# Patient Record
Sex: Male | Born: 1980 | Race: Black or African American | Hispanic: No | Marital: Single | Smoking: Former smoker
Health system: Southern US, Community
[De-identification: ages and names within clinical notes are randomized; demographics above are authoritative.]

## PROBLEM LIST (undated history)

## (undated) HISTORY — PX: ORTHOPEDIC SURGERY: SHX850

---

## 2001-06-30 ENCOUNTER — Encounter: Payer: Self-pay | Admitting: Internal Medicine

## 2001-06-30 ENCOUNTER — Emergency Department (HOSPITAL_COMMUNITY): Admission: EM | Admit: 2001-06-30 | Discharge: 2001-06-30 | Payer: Self-pay | Admitting: Emergency Medicine

## 2003-08-05 ENCOUNTER — Emergency Department (HOSPITAL_COMMUNITY): Admission: EM | Admit: 2003-08-05 | Discharge: 2003-08-05 | Payer: Self-pay | Admitting: Emergency Medicine

## 2005-02-19 ENCOUNTER — Emergency Department (HOSPITAL_COMMUNITY): Admission: EM | Admit: 2005-02-19 | Discharge: 2005-02-19 | Payer: Self-pay | Admitting: Emergency Medicine

## 2009-01-03 ENCOUNTER — Emergency Department (HOSPITAL_COMMUNITY): Admission: EM | Admit: 2009-01-03 | Discharge: 2009-01-03 | Payer: Self-pay | Admitting: Emergency Medicine

## 2009-03-01 ENCOUNTER — Emergency Department (HOSPITAL_COMMUNITY): Admission: EM | Admit: 2009-03-01 | Discharge: 2009-03-01 | Payer: Self-pay | Admitting: Emergency Medicine

## 2010-02-18 ENCOUNTER — Emergency Department (HOSPITAL_COMMUNITY): Admission: EM | Admit: 2010-02-18 | Discharge: 2010-02-18 | Payer: Self-pay | Admitting: Emergency Medicine

## 2010-02-21 ENCOUNTER — Emergency Department (HOSPITAL_COMMUNITY): Admission: EM | Admit: 2010-02-21 | Discharge: 2010-02-21 | Payer: Self-pay | Admitting: Emergency Medicine

## 2010-07-21 ENCOUNTER — Emergency Department (HOSPITAL_COMMUNITY): Payer: Self-pay

## 2010-07-21 ENCOUNTER — Emergency Department (HOSPITAL_COMMUNITY)
Admission: EM | Admit: 2010-07-21 | Discharge: 2010-07-22 | Disposition: A | Discharge status: Home / Self Care | Payer: Self-pay | Attending: Emergency Medicine | Admitting: Emergency Medicine

## 2010-07-21 DIAGNOSIS — M25579 Pain in unspecified ankle and joints of unspecified foot: Secondary | ICD-10-CM | POA: Insufficient documentation

## 2010-07-22 ENCOUNTER — Emergency Department (HOSPITAL_COMMUNITY): Payer: Self-pay

## 2010-10-12 ENCOUNTER — Emergency Department (HOSPITAL_COMMUNITY)
Admission: EM | Admit: 2010-10-12 | Discharge: 2010-10-13 | Disposition: A | Discharge status: Home / Self Care | Payer: Self-pay | Attending: Emergency Medicine | Admitting: Emergency Medicine

## 2010-10-12 DIAGNOSIS — K5289 Other specified noninfective gastroenteritis and colitis: Secondary | ICD-10-CM | POA: Insufficient documentation

## 2010-10-12 DIAGNOSIS — G40802 Other epilepsy, not intractable, without status epilepticus: Secondary | ICD-10-CM | POA: Insufficient documentation

## 2010-10-12 DIAGNOSIS — Z79899 Other long term (current) drug therapy: Secondary | ICD-10-CM | POA: Insufficient documentation

## 2010-10-12 LAB — BASIC METABOLIC PANEL
GFR calc non Af Amer: >60 mL/min (ref >60)
Potassium: 3.7 mEq/L (ref 3.5–5.1)
Sodium: 141 mEq/L (ref 135–145)

## 2010-10-12 LAB — URINALYSIS, ROUTINE W REFLEX MICROSCOPIC
Glucose, UA: NEGATIVE mg/dL
Hgb urine dipstick: NEGATIVE
Specific Gravity, Urine: 1.02 (ref 1.005–1.030)
Urobilinogen, UA: 1 mg/dL (ref 0.0–1.0)
pH: 7 (ref 5.0–8.0)

## 2010-10-12 LAB — URINE MICROSCOPIC-ADD ON

## 2010-10-12 LAB — DIFFERENTIAL
Basophils Absolute: 0 10*3/uL (ref 0.0–0.1)
Eosinophils Absolute: 0 10*3/uL (ref 0.0–0.7)
Eosinophils Relative: 0 % (ref 0–5)
Lymphocytes Relative: 12 % (ref 12–46)
Neutrophils Relative %: 79 % — ABNORMAL HIGH (ref 43–77)

## 2010-10-12 LAB — CBC
Platelets: 255 10*3/uL (ref 150–400)
RBC: 5.67 MIL/uL (ref 4.22–5.81)
RDW: 13.1 % (ref 11.5–15.5)
WBC: 14.6 10*3/uL — ABNORMAL HIGH (ref 4.0–10.5)

## 2010-10-12 LAB — HEPATIC FUNCTION PANEL
ALT: 16 U/L (ref 0–53)
AST: 14 U/L (ref 0–37)
Albumin: 4.5 g/dL (ref 3.5–5.2)
Indirect Bilirubin: 0.1 mg/dL — ABNORMAL LOW (ref 0.3–0.9)
Total Protein: 8.3 g/dL (ref 6.0–8.3)
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
## 2012-11-03 ENCOUNTER — Encounter (HOSPITAL_COMMUNITY): Payer: Self-pay | Admitting: *Deleted

## 2012-11-03 ENCOUNTER — Emergency Department (HOSPITAL_COMMUNITY)
Admission: EM | Admit: 2012-11-03 | Discharge: 2012-11-03 | Disposition: A | Discharge status: Home / Self Care | Payer: No Typology Code available for payment source | Attending: Emergency Medicine | Admitting: Emergency Medicine

## 2012-11-03 DIAGNOSIS — S4980XA Other specified injuries of shoulder and upper arm, unspecified arm, initial encounter: Secondary | ICD-10-CM | POA: Insufficient documentation

## 2012-11-03 DIAGNOSIS — S46909A Unspecified injury of unspecified muscle, fascia and tendon at shoulder and upper arm level, unspecified arm, initial encounter: Secondary | ICD-10-CM | POA: Insufficient documentation

## 2012-11-03 DIAGNOSIS — F17200 Nicotine dependence, unspecified, uncomplicated: Secondary | ICD-10-CM | POA: Insufficient documentation

## 2012-11-03 DIAGNOSIS — Y9389 Activity, other specified: Secondary | ICD-10-CM | POA: Insufficient documentation

## 2012-11-03 DIAGNOSIS — Y929 Unspecified place or not applicable: Secondary | ICD-10-CM | POA: Insufficient documentation

## 2012-11-03 DIAGNOSIS — M5380 Other specified dorsopathies, site unspecified: Secondary | ICD-10-CM | POA: Insufficient documentation

## 2012-11-03 DIAGNOSIS — M62830 Muscle spasm of back: Secondary | ICD-10-CM

## 2012-11-03 MED ORDER — CYCLOBENZAPRINE HCL 5 MG PO TABS: 5.0000 mg | ORAL_TABLET | Freq: Three times a day (TID) | ORAL | Status: DC | PRN

## 2012-11-03 MED ORDER — IBUPROFEN 600 MG PO TABS: 600.0000 mg | ORAL_TABLET | Freq: Four times a day (QID) | ORAL | Status: DC | PRN

## 2012-11-03 MED ORDER — IBUPROFEN 800 MG PO TABS
800.0000 mg | ORAL_TABLET | Freq: Once | ORAL | Status: AC
  Administered 2012-11-03: 800 mg via ORAL
  Filled 2012-11-03: qty 1
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
## 2012-11-07 NOTE — ED Provider Notes (Signed)
Medical screening examination/treatment/procedure(s) were performed by non-physician practitioner and as supervising physician I was immediately available for consultation/collaboration. Devoria Albe, MD, Armando Gang   Ward Givens, MD 11/07/12 1630

## 2013-03-02 ENCOUNTER — Emergency Department (HOSPITAL_COMMUNITY)
Admission: EM | Admit: 2013-03-02 | Discharge: 2013-03-02 | Disposition: A | Discharge status: Home / Self Care | Payer: Self-pay | Attending: Emergency Medicine | Admitting: Emergency Medicine

## 2013-03-02 ENCOUNTER — Encounter (HOSPITAL_COMMUNITY): Payer: Self-pay | Admitting: Emergency Medicine

## 2013-03-02 ENCOUNTER — Emergency Department (HOSPITAL_COMMUNITY): Payer: No Typology Code available for payment source

## 2013-03-02 DIAGNOSIS — R296 Repeated falls: Secondary | ICD-10-CM | POA: Insufficient documentation

## 2013-03-02 DIAGNOSIS — Y9389 Activity, other specified: Secondary | ICD-10-CM | POA: Insufficient documentation

## 2013-03-02 DIAGNOSIS — S62233A Other displaced fracture of base of first metacarpal bone, unspecified hand, initial encounter for closed fracture: Secondary | ICD-10-CM | POA: Insufficient documentation

## 2013-03-02 DIAGNOSIS — S62329A Displaced fracture of shaft of unspecified metacarpal bone, initial encounter for closed fracture: Secondary | ICD-10-CM

## 2013-03-02 DIAGNOSIS — Y929 Unspecified place or not applicable: Secondary | ICD-10-CM | POA: Insufficient documentation

## 2013-03-02 DIAGNOSIS — F17200 Nicotine dependence, unspecified, uncomplicated: Secondary | ICD-10-CM | POA: Insufficient documentation

## 2013-03-02 NOTE — ED Notes (Signed)
Patient states he fell backwards and caught himself with his right hand.  Patient c/o right hand pain.

## 2013-03-02 NOTE — ED Provider Notes (Signed)
CSN: [id number]     Arrival date & time 03/14/13  0113 History   First MD Initiated Contact with Patient 2013-03-14 0230     Chief Complaint  Patient presents with  . Hand Injury   (Consider location/radiation/quality/duration/timing/severity/associated sxs/prior Treatment) HPI Is a 32 year old male who fell backwards onto his right outstretched hand. This occurred several hours ago. He is now having pain and swelling over his right first metacarpal. There is moderate pain, worse with movement or palpation. Range of motion of his hand is limited due to pain and swelling. He denies numbness. He denies other injury.  History reviewed. No pertinent past medical history. Past Surgical History  Procedure Laterality Date  . Orthopedic surgery     No family history on file. History  Substance Use Topics  . Smoking status: Current Every Day Smoker  . Smokeless tobacco: Not on file  . Alcohol Use: No    Review of Systems  All other systems reviewed and are negative.    Allergies  Aspirin  Home Medications   Current Outpatient Rx  Name  Route  Sig  Dispense  Refill  . cyclobenzaprine (FLEXERIL) 5 MG tablet   Oral   Take 1 tablet (5 mg total) by mouth 3 (three) times daily as needed for muscle spasms.   15 tablet   0   . ibuprofen (ADVIL,MOTRIN) 600 MG tablet   Oral   Take 1 tablet (600 mg total) by mouth every 6 (six) hours as needed for pain.   24 tablet   0    BP 117/91  Pulse 90  Temp(Src) 98 F (36.7 C)  Resp 20  Ht 5\' 6"  (1.676 m)  Wt 170 lb (77.111 kg)  BMI 27.45 kg/m2  SpO2 100%  Physical Exam General: Well-developed, well-nourished male in no acute distress; appearance consistent with age of record HENT: normocephalic; atraumatic Eyes: Normal appearance Neck: supple; nontender Heart: regular rate and rhythm Lungs: Normal respiratory effort and excursion Abdomen: soft; nondistended Extremities: Swelling and tenderness over the right first metacarpal  without deformity; decreased range of motion of right hand due to pain; sensation intact with distal capillary refill brisk; no snuffbox tenderness Neurologic: Awake, alert and oriented; motor function intact in all extremities and symmetric; no facial droop Skin: Warm and dry Psychiatric: Normal mood and affect    ED Course  Procedures (including critical care time)  MDM  Nursing notes and vitals signs, including pulse oximetry, reviewed.  Summary of this visit's results, reviewed by myself:  Imaging Studies: Dg Hand Complete Right  03/14/2013   CLINICAL DATA:  Right hand injury, fall.  EXAM: RIGHT HAND - COMPLETE 3+ VIEW  COMPARISON:  None.  FINDINGS: A small bone fragment noted along the distal aspect of the right 1st metacarpal. There is mild overlying soft tissue swelling. Cannot exclude a small avulsed fragment. This may also be related to old injury. No additional acute bony abnormality.  IMPRESSION: Question small avulsed fragment off the distal aspect of the right 1st metacarpal.   Electronically Signed   By: Charlett Nose M.D.   On: 03/14/13 02:34        Hanley Seamen, MD 2013/03/14 [phone]

## 2014-03-30 ENCOUNTER — Emergency Department (HOSPITAL_COMMUNITY): Payer: Self-pay

## 2014-03-30 ENCOUNTER — Encounter (HOSPITAL_COMMUNITY): Payer: Self-pay | Admitting: Emergency Medicine

## 2014-03-30 ENCOUNTER — Emergency Department (HOSPITAL_COMMUNITY)
Admission: EM | Admit: 2014-03-30 | Discharge: 2014-03-30 | Disposition: A | Discharge status: Home / Self Care | Payer: Self-pay | Attending: Emergency Medicine | Admitting: Emergency Medicine

## 2014-03-30 DIAGNOSIS — S20219A Contusion of unspecified front wall of thorax, initial encounter: Secondary | ICD-10-CM | POA: Insufficient documentation

## 2014-03-30 DIAGNOSIS — Z79899 Other long term (current) drug therapy: Secondary | ICD-10-CM | POA: Insufficient documentation

## 2014-03-30 DIAGNOSIS — W01198A Fall on same level from slipping, tripping and stumbling with subsequent striking against other object, initial encounter: Secondary | ICD-10-CM | POA: Insufficient documentation

## 2014-03-30 DIAGNOSIS — R52 Pain, unspecified: Secondary | ICD-10-CM

## 2014-03-30 DIAGNOSIS — Z720 Tobacco use: Secondary | ICD-10-CM | POA: Insufficient documentation

## 2014-03-30 DIAGNOSIS — Z791 Long term (current) use of non-steroidal anti-inflammatories (NSAID): Secondary | ICD-10-CM | POA: Insufficient documentation

## 2014-03-30 DIAGNOSIS — Y9389 Activity, other specified: Secondary | ICD-10-CM | POA: Insufficient documentation

## 2014-03-30 DIAGNOSIS — L089 Local infection of the skin and subcutaneous tissue, unspecified: Secondary | ICD-10-CM | POA: Insufficient documentation

## 2014-03-30 DIAGNOSIS — S20211A Contusion of right front wall of thorax, initial encounter: Secondary | ICD-10-CM

## 2014-03-30 DIAGNOSIS — Y9289 Other specified places as the place of occurrence of the external cause: Secondary | ICD-10-CM | POA: Insufficient documentation

## 2014-03-30 DIAGNOSIS — S60922A Unspecified superficial injury of left hand, initial encounter: Secondary | ICD-10-CM | POA: Insufficient documentation

## 2014-03-30 DIAGNOSIS — R0781 Pleurodynia: Secondary | ICD-10-CM

## 2014-03-30 MED ORDER — SULFAMETHOXAZOLE-TRIMETHOPRIM 800-160 MG PO TABS: 1.0000 | ORAL_TABLET | Freq: Two times a day (BID) | ORAL | Status: AC

## 2014-03-30 MED ORDER — TETANUS-DIPHTH-ACELL PERTUSSIS 5-2.5-18.5 LF-MCG/0.5 IM SUSP: 0.5000 mL | Freq: Once | INTRAMUSCULAR | Status: DC
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
## 2014-03-30 NOTE — ED Notes (Signed)
Pt soaking his hand in saline and betadine soak per orders.

## 2014-03-30 NOTE — Discharge Instructions (Signed)
Follow up with your doctor in 3 days or return here for recheck. Return sooner for worsening symptoms.
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
## 2014-03-30 NOTE — ED Provider Notes (Signed)
Medical screening examination/treatment/procedure(s) were conducted as a shared visit with non-physician practitioner(s) and myself.  I personally evaluated the patient during the encounter.  Suspect local wound infection doubt flexor tenosynovitis or septic joint; patient's thumb with good flexion against resistance with full active and passive extension without pain; volar aspect of thumb over IP joint as well as the thenar eminence otherwise nontender.    Hurman HornJohn M Jimmie Rueter, MD 03/31/14 [phone]

## 2019-10-03 ENCOUNTER — Ambulatory Visit (INDEPENDENT_AMBULATORY_CARE_PROVIDER_SITE_OTHER): Payer: Self-pay | Admitting: Internal Medicine

## 2021-12-26 ENCOUNTER — Emergency Department (HOSPITAL_COMMUNITY)
Admission: EM | Admit: 2021-12-26 | Discharge: 2021-12-26 | Disposition: A | Discharge status: Home / Self Care | Payer: Medicaid Other | Attending: Emergency Medicine | Admitting: Emergency Medicine

## 2021-12-26 ENCOUNTER — Encounter (HOSPITAL_COMMUNITY): Payer: Self-pay

## 2021-12-26 ENCOUNTER — Other Ambulatory Visit: Payer: Self-pay

## 2021-12-26 DIAGNOSIS — R21 Rash and other nonspecific skin eruption: Secondary | ICD-10-CM

## 2021-12-26 MED ORDER — TRIAMCINOLONE ACETONIDE 0.1 % EX CREA: 1.0000 | TOPICAL_CREAM | Freq: Two times a day (BID) | CUTANEOUS | 0 refills | Status: DC

## 2021-12-26 NOTE — ED Triage Notes (Signed)
Rash on left arm and neck that started a few days again. Pt started a new supplement a few days ago. Pt reports that it itches sometimes.

## 2021-12-26 NOTE — Discharge Instructions (Addendum)
I sent you in a topical steroid that you can apply to your rash twice daily.  Please do not take this longer than 1 week as it can cause thinning of the skin and bleaching.  If you are still having symptoms of the rash in 1 week, please follow-up with your PCP for additional evaluation.  You can also take over-the-counter Benadryl which may help with your symptoms.

## 2021-12-27 NOTE — ED Provider Notes (Signed)
[facility] EMERGENCY DEPARTMENT Provider Note   CSN: [id number] Arrival date & time: 12/26/21  1503     History  Chief Complaint  Patient presents with   Rash    Andrew Robinson is a 41 y.o. male who presents to the ED for a rash on his left antecubital fossa and the back of his neck that started a few days ago.  Rash is nonpainful though it is occasionally pruritic.  He states that he started new supplement a few days ago which he thinks may have triggered his symptoms.  He denies new soaps, detergents, lotions.  He denies chest pain, shortness of breath, facial swelling, tongue swelling or hives.   Rash Associated symptoms: no fever, no nausea, no shortness of breath and not vomiting        Home Medications Prior to Admission medications   Medication Sig Start Date End Date Taking? Authorizing Provider  triamcinolone cream (KENALOG) 0.1 % Apply 1 Application topically 2 (two) times daily. 12/26/21  Yes Raynald Blend R, PA-C  cyclobenzaprine (FLEXERIL) 5 MG tablet Take 1 tablet (5 mg total) by mouth 3 (three) times daily as needed for muscle spasms. 11/03/12   Burgess Amor, PA-C  ibuprofen (ADVIL,MOTRIN) 600 MG tablet Take 1 tablet (600 mg total) by mouth every 6 (six) hours as needed for pain. 11/03/12   Burgess Amor, PA-C      Allergies    Aspirin    Review of Systems   Review of Systems  Constitutional:  Negative for fever.  HENT:  Negative for facial swelling.   Respiratory:  Negative for shortness of breath.   Cardiovascular:  Negative for chest pain.  Gastrointestinal:  Negative for nausea and vomiting.  Skin:  Positive for rash.    Physical Exam Updated Vital Signs BP 121/85 (BP Location: Right Arm)   Pulse 91   Temp 97.9 F (36.6 C) (Oral)   Resp 18   Ht 5\' 5"  (1.651 m)   Wt 90.7 kg   SpO2 98%   BMI 33.28 kg/m  Physical Exam Vitals and nursing note reviewed.  Constitutional:      General: He is not in acute distress.    Appearance: He is not  ill-appearing.  HENT:     Head: Atraumatic.  Eyes:     Conjunctiva/sclera: Conjunctivae normal.  Cardiovascular:     Rate and Rhythm: Normal rate and regular rhythm.     Pulses: Normal pulses.     Heart sounds: No murmur heard. Pulmonary:     Effort: Pulmonary effort is normal. No respiratory distress.     Breath sounds: Normal breath sounds.  Abdominal:     General: Abdomen is flat. There is no distension.     Palpations: Abdomen is soft.     Tenderness: There is no abdominal tenderness.  Musculoskeletal:        General: Normal range of motion.     Cervical back: Normal range of motion.  Skin:    General: Skin is warm and dry.     Capillary Refill: Capillary refill takes less than 2 seconds.     Comments: Small erythematous rash on the left antecubital fossa and on the back of his neck.  No urticaria, excoriations, induration or papules.  Neurological:     General: No focal deficit present.     Mental Status: He is alert.  Psychiatric:        Mood and Affect: Mood normal.     ED Results /  Procedures / Treatments   Labs (all labs ordered are listed, but only abnormal results are displayed) Labs Reviewed - No data to display  EKG None  Radiology No results found.  Procedures Procedures    Medications Ordered in ED Medications - No data to display  ED Course/ Medical Decision Making/ A&P                           Medical Decision Making Risk Prescription drug management.   41 year old male presents to the emergency department for evaluation of a rash on his left arm and the back of his neck.  Differentials include contact dermatitis, allergic reaction, fungal infection.  Vitals are without significant abnormality on exam, small erythematous rash on the left antecubital fossa and across the back of his neck.  No urticaria, papules or induration.  Symptoms appear to be most consistent with a contact dermatitis.  Will send home with steroid cream to use twice daily  x1 week.  Advised on risks of using steroid therapy for longer than the recommended week.  If he is still having symptoms, he should follow-up with his PCP.  Patient expresses understanding and is amenable to plan.  Discharged home in stable condition. Final Clinical Impression(s) / ED Diagnoses Final diagnoses:  Rash and nonspecific skin eruption    Rx / DC Orders ED Discharge Orders          Ordered    triamcinolone cream (KENALOG) 0.1 %  2 times daily        12/26/21 1557              Janell Quiet, [place] 12/27/21 0912    Bethann Berkshire, MD 12/27/21 1112

## 2022-07-18 DIAGNOSIS — Z87891 Personal history of nicotine dependence: Secondary | ICD-10-CM | POA: Insufficient documentation

## 2022-07-18 DIAGNOSIS — R55 Syncope and collapse: Secondary | ICD-10-CM | POA: Diagnosis not present

## 2022-07-19 ENCOUNTER — Encounter (HOSPITAL_COMMUNITY): Payer: Self-pay

## 2022-07-19 ENCOUNTER — Emergency Department (HOSPITAL_COMMUNITY)
Admission: EM | Admit: 2022-07-19 | Discharge: 2022-07-19 | Disposition: A | Discharge status: Home / Self Care | Payer: 59 | Attending: Emergency Medicine | Admitting: Emergency Medicine

## 2022-07-19 ENCOUNTER — Other Ambulatory Visit: Payer: Self-pay

## 2022-07-19 DIAGNOSIS — R55 Syncope and collapse: Secondary | ICD-10-CM

## 2022-07-19 LAB — COMPREHENSIVE METABOLIC PANEL
ALT: 24 U/L (ref 0–44)
AST: 20 U/L (ref 15–41)
Albumin: 3.8 g/dL (ref 3.5–5.0)
Alkaline Phosphatase: 51 U/L (ref 38–126)
Anion gap: 6 (ref 5–15)
BUN: 12 mg/dL (ref 6–20)
CO2: 26 mmol/L (ref 22–32)
Calcium: 8.7 mg/dL — ABNORMAL LOW (ref 8.9–10.3)
Chloride: 106 mmol/L (ref 98–111)
Creatinine, Ser: 1.2 mg/dL (ref 0.61–1.24)
GFR, Estimated: >60 mL/min (ref >60)
Glucose, Bld: 99 mg/dL (ref 70–99)
Potassium: 3.4 mmol/L — ABNORMAL LOW (ref 3.5–5.1)
Sodium: 138 mmol/L (ref 135–145)
Total Bilirubin: 0.3 mg/dL (ref 0.3–1.2)
Total Protein: 7.2 g/dL (ref 6.5–8.1)

## 2022-07-19 LAB — CBC
HCT: 39.7 % (ref 39.0–52.0)
Hemoglobin: 12.9 g/dL — ABNORMAL LOW (ref 13.0–17.0)
MCH: 27.3 pg (ref 26.0–34.0)
MCHC: 32.5 g/dL (ref 30.0–36.0)
MCV: 84.1 fL (ref 80.0–100.0)
Platelets: 267 10*3/uL (ref 150–400)
RBC: 4.72 MIL/uL (ref 4.22–5.81)
RDW: 13.5 % (ref 11.5–15.5)
WBC: 11 10*3/uL — ABNORMAL HIGH (ref 4.0–10.5)
nRBC: 0 % (ref 0.0–0.2)

## 2022-07-19 LAB — TROPONIN I (HIGH SENSITIVITY): Troponin I (High Sensitivity): 3 ng/L (ref <18)

## 2022-07-19 NOTE — Discharge Instructions (Signed)
You were evaluated in the Emergency Department and after careful evaluation, we did not find any emergent condition requiring admission or further testing in the hospital.  Your exam/testing today is overall reassuring.  Blood work did not show any abnormalities.  Recommend follow-up with your primary care doctor.  Please return to the Emergency Department if you experience any worsening of your condition.   Thank you for allowing [place] to be a part of your care.

## 2022-07-19 NOTE — ED Provider Notes (Signed)
[facility] Emergency Department Provider Note MRN:  [id number]  Arrival date & time: 07/19/22     Chief Complaint   Loss of Consciousness   History of Present Illness   Andrew Robinson is a 42 y.o. year-old male with no pertinent past medical history presenting to the ED with chief complaint of loss of consciousness.  Patient got up quickly from bed last night and then passed out.  Get dizzy before hand.  Feels fine right now but notices blood pressure was high.  No recent chest pain or shortness of breath, no leg pain or swelling, domino pain, no drugs or alcohol or tobacco use.  Review of Systems  A thorough review of systems was obtained and all systems are negative except as noted in the HPI and PMH.   Patient's Health History   History reviewed. No pertinent past medical history.  Past Surgical History:  Procedure Laterality Date   ORTHOPEDIC SURGERY      History reviewed. No pertinent family history.  Social History   Socioeconomic History   Marital status: Single    Spouse name: Not on file   Number of children: Not on file   Years of education: Not on file   Highest education level: Not on file  Occupational History   Not on file  Tobacco Use   Smoking status: Former    Types: Cigarettes   Smokeless tobacco: Never  Vaping Use   Vaping Use: Never used  Substance and Sexual Activity   Alcohol use: Yes    Comment: occasionally   Drug use: No   Sexual activity: Not on file  Other Topics Concern   Not on file  Social History Narrative   Not on file   Social Determinants of Health   Financial Resource Strain: Not on file  Food Insecurity: Not on file  Transportation Needs: Not on file  Physical Activity: Not on file  Stress: Not on file  Social Connections: Not on file  Intimate Partner Violence: Not on file     Physical Exam   Vitals:   07/19/22 0027 07/19/22 0045  BP: 128/87 (!) 122/90  Pulse: 81 89  Resp: 18 18  Temp:  98.1 F (36.7 C)   SpO2: 96% 97%    CONSTITUTIONAL: Well-appearing, NAD NEURO/PSYCH:  Alert and oriented x 3, no focal deficits EYES:  eyes equal and reactive ENT/NECK:  no LAD, no JVD CARDIO: Regular rate, well-perfused, normal S1 and S2 PULM:  CTAB no wheezing or rhonchi GI/GU:  non-distended, non-tender MSK/SPINE:  No gross deformities, no edema SKIN:  no rash, atraumatic   *Additional and/or pertinent findings included in MDM below  Diagnostic and Interventional Summary    EKG Interpretation  Date/Time:  July 19, 2022 at 00:41:57 Ventricular Rate:   74 PR Interval:   135 QRS Duration:  98 QT Interval: 381   QTC Calculation:423   R Axis:     Text Interpretation: Sinus rhythm, diffuse T wave inversions, no prior for comparison Confirm with Dr. Shela Nevin at 1:14 AM       Labs Reviewed  CBC - Abnormal; Notable for the following components:      Result Value   WBC 11.0 (*)    Hemoglobin 12.9 (*)    All other components within normal limits  COMPREHENSIVE METABOLIC PANEL - Abnormal; Notable for the following components:   Potassium 3.4 (*)    Calcium 8.7 (*)    All other components within normal  limits  TROPONIN I (HIGH SENSITIVITY)    No orders to display    Medications - No data to display   Procedures  /  Critical Care Procedures  ED Course and Medical Decision Making  Initial Impression and Ddx Syncopal episode favored due to orthostatic hypotension, got up very quickly because he had to have a bowel movement.  No hypertension at this time, not having any chest pain or shortness of breath or signs of DVT, overall highly doubt emergent process.  EKG revealing diffuse T wave inversions, no prior for comparison.  Past medical/surgical history that increases complexity of ED encounter: None  Interpretation of Diagnostics I personally reviewed the EKG and my interpretation is as follows: Sinus rhythm, T wave inversions  Labs reassuring with no  significant blood count or electrolyte disturbance, troponin negative  Patient Reassessment and Ultimate Disposition/Management     Patient continues to look and feel well with normal vital signs and no symptoms, appropriate for discharge.  Patient management required discussion with the following services or consulting groups:  None  Complexity of Problems Addressed Acute illness or injury that poses threat of life of bodily function  Additional Data Reviewed and Analyzed Further history obtained from: None  Additional Factors Impacting ED Encounter Risk None  Barth Kirks. [place] Small, [place] [email or web address]  Final Clinical Impressions(s) / ED Diagnoses     ICD-10-CM   1. Syncope, unspecified syncope type  R55       ED Discharge Orders     None        Discharge Instructions Discussed with and Provided to Patient:     Discharge Instructions      You were evaluated in the Emergency Department and after careful evaluation, we did not find any emergent condition requiring admission or further testing in the hospital.  Your exam/testing today is overall reassuring.  Blood work did not show any abnormalities.  Recommend follow-up with your primary care doctor.  Please return to the Emergency Department if you experience any worsening of your condition.   Thank you for allowing [place] to be a part of your care.       Maudie Flakes, MD 07/19/22 [phone]

## 2022-07-19 NOTE — ED Triage Notes (Signed)
Pt states that the night before he got up from sleep to use the restroom because his stomach was hurting and passed out. Pt states he fell. Pt states he fell onto right hand, states it is a little sore. Pt states he is unsure if it was his BP or not. Pt states when he went to the dentist 1.5weeks ago, his BP was elevated. Pt denies pain, nausea, vomiting, weakness or dizziness at this time.

## 2023-02-12 ENCOUNTER — Emergency Department (HOSPITAL_COMMUNITY)
Admission: EM | Admit: 2023-02-12 | Discharge: 2023-02-13 | Disposition: A | Discharge status: Home / Self Care | Payer: 59 | Attending: Emergency Medicine | Admitting: Emergency Medicine

## 2023-02-12 DIAGNOSIS — S40012A Contusion of left shoulder, initial encounter: Secondary | ICD-10-CM | POA: Diagnosis not present

## 2023-02-12 DIAGNOSIS — Y92410 Unspecified street and highway as the place of occurrence of the external cause: Secondary | ICD-10-CM | POA: Insufficient documentation

## 2023-02-12 DIAGNOSIS — S4992XA Unspecified injury of left shoulder and upper arm, initial encounter: Secondary | ICD-10-CM | POA: Diagnosis not present

## 2023-02-12 DIAGNOSIS — M79602 Pain in left arm: Secondary | ICD-10-CM | POA: Diagnosis not present

## 2023-02-13 ENCOUNTER — Encounter (HOSPITAL_COMMUNITY): Payer: Self-pay

## 2023-02-13 ENCOUNTER — Emergency Department (HOSPITAL_COMMUNITY): Payer: 59

## 2023-02-13 DIAGNOSIS — M79602 Pain in left arm: Secondary | ICD-10-CM | POA: Diagnosis not present

## 2023-02-13 MED ORDER — HYDROCODONE-ACETAMINOPHEN 5-325 MG PO TABS
1.0000 | ORAL_TABLET | Freq: Once | ORAL | Status: AC
  Administered 2023-02-13: 1 via ORAL
  Filled 2023-02-13: qty 1

## 2023-02-13 MED ORDER — METHOCARBAMOL 500 MG PO TABS: 500.0000 mg | ORAL_TABLET | Freq: Three times a day (TID) | ORAL | 0 refills | Status: AC | PRN
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
## 2023-02-24 DIAGNOSIS — Z23 Encounter for immunization: Secondary | ICD-10-CM | POA: Diagnosis not present

## 2023-02-24 DIAGNOSIS — Z0000 Encounter for general adult medical examination without abnormal findings: Secondary | ICD-10-CM | POA: Diagnosis not present

## 2023-03-09 ENCOUNTER — Ambulatory Visit: Payer: 59 | Admitting: Internal Medicine

## 2023-03-15 ENCOUNTER — Encounter: Payer: Self-pay | Admitting: Internal Medicine
